# Patient Record
Sex: Male | Born: 1965 | Hispanic: No | Marital: Married | Smoking: Never smoker
Health system: Southern US, Community
[De-identification: ages and names within clinical notes are randomized; demographics above are authoritative.]

## PROBLEM LIST (undated history)

## (undated) DIAGNOSIS — J45909 Unspecified asthma, uncomplicated: Secondary | ICD-10-CM

---

## 2009-06-10 ENCOUNTER — Encounter: Payer: Self-pay | Admitting: Pulmonary Disease

## 2009-06-29 ENCOUNTER — Encounter: Payer: Self-pay | Admitting: Pulmonary Disease

## 2009-07-20 ENCOUNTER — Ambulatory Visit: Payer: Self-pay | Admitting: Pulmonary Disease

## 2009-07-20 DIAGNOSIS — R05 Cough: Secondary | ICD-10-CM

## 2009-08-16 ENCOUNTER — Encounter: Payer: Self-pay | Admitting: Pulmonary Disease

## 2009-08-17 ENCOUNTER — Encounter: Payer: Self-pay | Admitting: Pulmonary Disease

## 2009-08-21 ENCOUNTER — Ambulatory Visit: Payer: Self-pay | Admitting: Pulmonary Disease

## 2009-08-21 ENCOUNTER — Telehealth: Payer: Self-pay | Admitting: Pulmonary Disease

## 2009-08-21 DIAGNOSIS — R599 Enlarged lymph nodes, unspecified: Secondary | ICD-10-CM | POA: Insufficient documentation

## 2009-08-22 ENCOUNTER — Telehealth: Payer: Self-pay | Admitting: Pulmonary Disease

## 2009-08-25 ENCOUNTER — Ambulatory Visit: Payer: Self-pay | Admitting: Pulmonary Disease

## 2009-08-25 ENCOUNTER — Ambulatory Visit: Admission: RE | Admit: 2009-08-25 | Discharge: 2009-08-25 | Payer: Self-pay | Admitting: Pulmonary Disease

## 2009-08-26 DIAGNOSIS — J984 Other disorders of lung: Secondary | ICD-10-CM

## 2009-08-31 ENCOUNTER — Telehealth (INDEPENDENT_AMBULATORY_CARE_PROVIDER_SITE_OTHER): Payer: Self-pay | Admitting: *Deleted

## 2009-09-01 ENCOUNTER — Ambulatory Visit: Payer: Self-pay | Admitting: Pulmonary Disease

## 2009-09-01 LAB — CONVERTED CEMR LAB: Sed Rate: 15 mm/hr (ref 0–22)

## 2009-09-09 ENCOUNTER — Encounter: Payer: Self-pay | Admitting: Pulmonary Disease

## 2009-09-29 ENCOUNTER — Ambulatory Visit: Payer: Self-pay | Admitting: Pulmonary Disease

## 2009-12-22 ENCOUNTER — Ambulatory Visit: Payer: Self-pay | Admitting: Pulmonary Disease

## 2010-01-23 ENCOUNTER — Ambulatory Visit: Payer: Self-pay | Admitting: Radiology

## 2010-01-23 ENCOUNTER — Ambulatory Visit (HOSPITAL_BASED_OUTPATIENT_CLINIC_OR_DEPARTMENT_OTHER): Admission: RE | Admit: 2010-01-23 | Discharge: 2010-01-23 | Payer: Self-pay | Admitting: Pulmonary Disease

## 2010-01-23 ENCOUNTER — Ambulatory Visit: Payer: Self-pay | Admitting: Pulmonary Disease

## 2010-01-23 ENCOUNTER — Telehealth (INDEPENDENT_AMBULATORY_CARE_PROVIDER_SITE_OTHER): Payer: Self-pay | Admitting: *Deleted

## 2010-01-23 DIAGNOSIS — J329 Chronic sinusitis, unspecified: Secondary | ICD-10-CM | POA: Insufficient documentation

## 2010-02-21 ENCOUNTER — Telehealth (INDEPENDENT_AMBULATORY_CARE_PROVIDER_SITE_OTHER): Payer: Self-pay | Admitting: *Deleted

## 2010-03-07 ENCOUNTER — Telehealth: Payer: Self-pay | Admitting: Pulmonary Disease

## 2010-03-13 ENCOUNTER — Ambulatory Visit: Payer: Self-pay | Admitting: Pulmonary Disease

## 2010-04-17 ENCOUNTER — Telehealth: Payer: Self-pay | Admitting: Pulmonary Disease

## 2010-07-24 NOTE — Progress Notes (Signed)
Summary: work note faxed  Phone Note Call from Patient   Caller: Spouse Call For: Hunter Stanley Summary of Call: pt was in today to see dr Vassie Loll  he need a work note faxed to his job at [phone] made to the attn: of [employer]. Initial call taken by: Rickard Patience,  January 23, 2010 1:59 PM  Follow-up for Phone Call        faxed work note to # provided and called and spoke with pt's wife and she is aware of this. nothing further needed.  Aundra Millet Reynolds LPN  January 23, 2010 2:12 PM

## 2010-07-24 NOTE — Letter (Signed)
Summary: [employer]  [address]   [place], [place] [address]   Phone: [phone]  Fax: [phone]    Today's Date: January 23, 2010  Name of Patient: Hunter Stanley  The above named patient had a medical visit today at:  11:00am  Please take this into consideration when reviewing the time away from work/school.    Special Instructions:  [ X ] None  [  ] To be off the remainder of today, returning to the normal work / school schedule tomorrow.  [  ] To be off until the next scheduled appointment on ______________________.  [  ] Other ________________________________________________________________ ________________________________________________________________________   Sincerely yours,   Cyril Mourning, M.D.

## 2010-07-24 NOTE — Assessment & Plan Note (Signed)
Summary: cough/pt ok w/[facility] office/apc   Visit Type:  Acute Copy to:  self Primary Provider/Referring Provider:  n/a  CC:  [name].  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 Cough disapeared after bscopy in april '11. He feels it may have ben related to cold weather. Then relapsed 7/11 - no clear environmental or work related  trigger.  Cough was of insidious onset, no preceding URI,  mostly non productive. He denies allergies, has lived in [place] x 23 yrs. No heartburn, recurrent chest colds or post nasaldrip, or childhood hisotry of asthma or wheezing. No fevers or exposure to TB. CXR 06/29/09 no infx or effusions. [facility] ED visit - reviewed labs WC 11.4, BUn/ cr 10/0.87, AEC 900 CT chest reviewed -3 mm nodule LLL, mild mediastinal lymphadenopathy bihilar, & subcarinal (calcified)   Reviewed bscopy data, afb/ fungal cx neg. Bronchial tissue shows infalmmation - lymphocytes mixed with eosinophils , no granulomas. Absolute eos count 900  -reviewed allergy testing  pos for grass, roach , IgE 196 Spirometry 7/11  (coughing fits) showed moderate restriction, no obstruction  Therapy -Acid suppression x 6 weeks +CPM/ PSE combination did not help Tessalon perles did not help. Advair & avelox have helped.    Tussionex helps x 4h , promet- DM makes him nauseous   January 23, 2010 11:21 AM  Cough improved marginally then returned , he stopped qvar due to thinning of his gums. Has been of work x 4 ds. Saw ENT - put on omeprazole. Cannot sleep. Did not take zyrtec. Reviewed bscopydata & / allergy testing  Preventive Screening-Counseling & Management  Alcohol-Tobacco     Smoking Status: quit     Packs/Day: 0.5     Year Started: 1985     Year Quit: 2005  Current Medications  (verified): 1)  Vicks Nyquil Cough 6.25-15 Mg/60ml Liqd (Doxylamine-Dm) .... At Bedtime 2)  Qvar 80 Mcg/act Aers (Beclomethasone Dipropionate) .... Inhale 2 Puffs Two Times A Day 3)  Omeprazole 40 Mg Cpdr (Omeprazole) .... Take 1 Tablet By Mouth Two Times A Day 4)  Tussionex Pennkinetic Er 8-10 Mg/31ml Lqcr (Chlorpheniramine-Hydrocodone) .... 2.5 - 5ml By Mouth Two Times A Day As Needed  Allergies (verified): No Known Drug Allergies  Past History:  Past Medical History: sinusitis eosinophilic bronchitis  Review of Systems       The patient complains of prolonged cough.  The patient denies anorexia, fever, weight loss, weight gain, vision loss, decreased hearing, hoarseness, chest pain, syncope, dyspnea on exertion, peripheral edema, headaches, hemoptysis, abdominal pain, melena, hematochezia, severe indigestion/heartburn, hematuria, muscle weakness, suspicious skin lesions, difficulty walking, depression, unusual weight change, and abnormal bleeding.    Vital Signs:  Patient profile:   45 year old male Height:      63 inches Weight:      134 pounds BMI:     23.82 O2 Sat:      94 % on Room air Temp:     98.4 degrees F oral Pulse rate:   82 / minute BP sitting:   108 / 74  (left arm) Cuff size:   regular  Vitals Entered By: Zackery Barefoot CMA (January 23, 2010 11:13 AM)  O2 Flow:  Room air CC: Pt c/o increased non-productive coughing  spells, wheezing worse at night and early morning. Pt states it is not as bad in the day time. Pt c/o "teeth thinning" Comments Medications reviewed with patient Verified contact number and pharmacy with patient Zackery Barefoot CMA  January 23, 2010 11:13 AM    Physical Exam  Additional Exam:  Gen. Pleasant, well-nourished, in no distress, normal affect ENT - no lesions, no post nasal drip Neck: No JVD, no thyromegaly, no carotid bruits Lungs: no use of accessory muscles, no dullness to percussion, clear without rales or rhonchi    Cardiovascular: Rhythm regular, heart sounds  normal, no murmurs or gallops, no peripheral edema Musculoskeletal: No deformities, no cyanosis or clubbing      Imaging Exam  Procedure date:  01/23/2010  Findings:      CT sinuses  Findings: .  Diffuse circumferential mucosal thickening is present in the maxillary sinuses bilaterally.  There is scattered opacification throughout the ethmoid air cells.  The frontal sinuses are completely occluded bilaterally.  Circumferential mucosal thickening is noted in the sphenoid sinuses bilaterally. No definite mass lesion is seen.  The nasal septum is midline.   IMPRESSION:   1.  Extensive sinus disease as described. 2.  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  Impression & Recommendations:  Problem # 1:  SINUSITIS, CHRONIC (ICD-473.9) Given CT results, augmentin x 14-28 days ENT referral ? drainage  Problem # 2:  COUGH (ICD-786.2) Prednisone course  Stay on qvar two times a day  Cough syrup upto three times a day  - not to operate heavy machinery or driving after taking this Stay on reflux pill & zyrtec -D once daily  Orders: Radiology Referral (Radiology) Misc. Referral (Misc. Ref) Est. Patient Level IV ([address]) Prescription Created Electronically ([id number])  Medications Added to Medication List This Visit: 1)  Qvar 80 Mcg/act Aers (Beclomethasone dipropionate) .... Inhale 2 puffs two times a day 2)  Omeprazole 40 Mg Cpdr (Omeprazole) .... Take 1 tablet by mouth two times a day 3)  Tussionex Pennkinetic Er 8-10 Mg/57ml Lqcr (Chlorpheniramine-hydrocodone) .... 2.5 - 5ml by mouth two times a day as needed 4)  Tussionex Pennkinetic Er 8-10 Mg/66ml Lqcr (Chlorpheniramine-hydrocodone) .... 2.5 - 5ml by mouth  three times a day 5)  Prednisone 10 Mg Tabs (Prednisone) .... Take 4 tabs  daily with food x 4 days, then 3 tabs daily x 4 days, then 2 tabs daily x 7 days, then 1 tab daily x47days then stop. #50  Patient  Instructions: 1)  Prednisone course  2)  Stay on qvar two times a day  3)  Cough syrup upto three times a day  - not to operate heavy machinery or driving after taking this 4)  Stay on reflux pill & zyrtec -D once daily  5)  A Limited Sinus CT has been recommended.  Your imaging study may require preauthorization.  6)  We will get you second opinion Prescriptions: TUSSIONEX PENNKINETIC ER 8-10 MG/5ML LQCR (CHLORPHENIRAMINE-HYDROCODONE) 2.5 - 5ml by mouth  three times a day  #200 x 0   Entered and Authorized by:   Comer Locket. Vassie Loll MD   Signed by:   Comer Locket Vassie Loll MD on 01/23/2010   Method used:   Print then Give to Patient   RxID:   [id number] PREDNISONE 10 MG TABS (PREDNISONE) Take 4 tabs  daily with food x 4 days, then 3 tabs daily x 4 days, then 2 tabs daily x 7 days, then 1 tab daily x47days then  stop. #50  #50 x 0   Entered and Authorized by:   Comer Locket Vassie Loll MD   Signed by:   Comer Locket Vassie Loll MD on 01/23/2010   Method used:   Electronically to        [facility]* (retail)       [address]       [place], [place]  [address]       Ph: [phone]       Fax: [phone]   RxID:   [id number]

## 2010-07-24 NOTE — Progress Notes (Signed)
Summary: dry cough returned x2days - needs to f/u with ENT  Phone Note Call from Patient Call back at [phone]   Caller: Spouse Call For: alva Summary of Call: need to talk to dr Vassie Loll about husband's condition Initial call taken by: Rickard Patience,  February 21, 2010 5:00 PM  Follow-up for Phone Call        called spoke with patient's wife who states patient dry cough has returned x2 days with some increased SOB.  denies wheezing, mucus production.  wife states that the pred taper patient was given at last ov helped cough a great deal and wonders if patient may have a refill.  would also like to know if patient may have a work note excusing him from work aug 14-18.  wife is aware that RA is out of the office that they may not receive a call back until tomorrow.  she is okay with this.  will forward to doc of the day for today.  she is also aware that RA will have to be the one to approve/deny the work note. Boone Master CNA/MA  February 21, 2010 5:17 PM   Additional Follow-up for Phone Call Additional follow up Details #1::        the pt had terrible sinusitis on his ct scan...was referred to ENT...did he go?Marland KitchenMarland KitchenMarland KitchenHis cough may not resolve until his sinus issue resolves.  Is he scheduled for f/u with ENT? Additional Follow-up by: Barbaraann Share MD,  February 21, 2010 5:31 PM    Additional Follow-up for Phone Call Additional follow up Details #2::    ATC thehome #, no answer, no voicemail. Also called other number given and [email or web address].Carron Curie CMA  February 22, 2010 10:01 AM  [facility] on the "other number" Vernie Murders  February 23, 2010 11:45 AM  pt wife returned call.  she states that they cancelled the first ENT appt scheduled for 8.12.11 and has not rescheduled this.  i advised pt's wife that cough may not resolve until the sinus issues resolve and that they need to go ahead and rsc the ENT appt.  will forward to doc of the day for further recs.  Dr. Delford Field, is there anything i need to  add?  wife states may leave answer on her VM. Boone Master CNA/MA  February 23, 2010 12:05 PM     Additional Follow-up for Phone Call Additional follow up Details #3:: Details for Additional Follow-up Action Taken: I have nothing else to add to dr clance recommendations  Additional Follow-up by: Storm Frisk MD,  February 23, 2010 12:16 PM    called LM on wife's VM voicing that they need to reschedule the ENT appt as stated by both [facility] and [facility] above.  encuoraged her to call with any other questions/concerns.  will sign off on message and forward to Dr. Vassie Loll to make him aware. Boone Master CNA/MA  February 23, 2010 12:20 PM   Appended Document: dry cough returned x2days - needs to f/u with ENT can we FU to see if they rescheduled ent appt  Appended Document: dry cough returned x2days - needs to f/u with ENT LMOMTCB x 1 on wife's vmail. jwr  Appended Document: dry cough returned x2days - needs to f/u with ENT Pt was seen by Dr. Kathi Der PA on Friday, pt seen today, CT Sinus faxed to Dr. Kathi Der office and pt's spouse aware will need to pick up film from [place] to take to ENT  follow up appointment. jwr

## 2010-07-24 NOTE — Progress Notes (Signed)
Summary: ? ENT followup   Phone Note Call from Patient Call back at [phone]   Caller: Spouse-oun Call For: clance Reason for Call: Talk to Nurse Summary of Call: pt returned call to [facility].  She said when you call back leave it on cell phone.   Initial call taken by: Eugene Gavia,  March 07, 2010 1:56 PM  Follow-up for Phone Call        [facility] on pt's spouse Uon's cell phone advising that we need to know if pt kept appt with ENT.  Will await a call back Vernie Murders  March 07, 2010 2:35 PM  Spoke with pt's spouse, Uon.  Per Uon, they never made appt for ENT because pt "didn't want to go."  States he is still coughing "bad."  States this clears up when he is on meds but when he finishes it cough returns.  Advised cough might not resolve until sinus issues are resolved and that's why RA wanted him to see ENT.  She verbalized understanding and states they might call to schedule appt with ENT.  Will forward to RA so he is aware and in case he has any further recs.  **Spouse requesting to leave message on her VM if we call back.   Follow-up by: Gweneth Dimitri RN,  March 08, 2010 11:33 AM  Additional Follow-up for Phone Call Additional follow up Details #1::        please emphasize it is absolutely important for him to see ENT  Additional Follow-up by: Comer Locket. Vassie Loll MD,  March 08, 2010 11:43 AM    Additional Follow-up for Phone Call Additional follow up Details #2::    Never received return call. Pt seen in office today. Zackery Barefoot CMA  March 13, 2010 9:18 AM

## 2010-07-24 NOTE — Miscellaneous (Signed)
Summary: Allergy Panel/[facility]  Allergy Panel/[facility]   Imported By: Sherian Rein 01/03/2010 08:54:01  _____________________________________________________________________  External Attachment:    Type:   Image     Comment:   External Document

## 2010-07-24 NOTE — Assessment & Plan Note (Signed)
Summary: cough/in [facility] office/apc   Copy to:  self Primary Provider/Referring Provider:  n/a  CC:  Pt here for follow up. Pt states was seen by ENT on Friday and was given Nasonex w/o relief. Pt states cough returned x 2 weeks ago after running out of meds Dr. Vassie Loll prescribed.  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 Cough disapeared after bscopy in april '11. He feels it may have ben related to cold weather. Then relapsed 7/11 - no clear environmental or work related  trigger.  Cough was of insidious onset, no preceding URI,  mostly non productive. He denies allergies, has lived in [place] x 23 yrs. No heartburn, recurrent chest colds or post nasaldrip, or childhood hisotry of asthma or wheezing. No fevers or exposure to TB. CXR 06/29/09 no infx or effusions. [facility] ED visit - reviewed labs WC 11.4, BUn/ cr 10/0.87, AEC 900 CT chest reviewed -3 mm nodule LLL, mild mediastinal lymphadenopathy bihilar, & subcarinal (calcified)   Reviewed bscopy data, afb/ fungal cx neg. Bronchial tissue shows infalmmation - lymphocytes mixed with eosinophils , no granulomas. Absolute eos count 900  -reviewed allergy testing  pos for grass, roach , IgE 196 Spirometry 7/11  (coughing fits) showed moderate restriction, no obstruction  Therapy -Acid suppression x 6 weeks +CPM/ PSE combination did not help Tessalon perles did not help. Advair & avelox have helped.    Tussionex helps x 4h , promet- DM makes him nauseous   January 23, 2010 11:21 AM  Cough improved marginally then returned , he stopped qvar due to thinning of his gums. Has been of work x 4 ds. Saw ENT - put on omeprazole. Cannot sleep. Did not take zyrtec. Reviewed bscopydata & / allergy testing  March 13, 2010 9:38 AM  Saw Dr Jearld Fenton on 9/16 --nasonex, awaiting CT review. Cough has returned, felt much improved after Abx course, no heartburn  Preventive Screening-Counseling &  Management  Alcohol-Tobacco     Smoking Status: quit     Packs/Day: 0.5     Year Started: 1985     Year Quit: 2005  Current Medications (verified): 1)  Vicks Nyquil Cough 6.25-15 Mg/19ml Liqd (Doxylamine-Dm) .... At Bedtime 2)  Qvar 80 Mcg/act Aers (Beclomethasone Dipropionate) .... Inhale 2 Puffs Two Times A Day As Needed 3)  Zyrtec Allergy 10 Mg Tabs (Cetirizine Hcl) .... Take 1 Tablet By Mouth Two Times A Day 4)  Nasonex 50 Mcg/act Susp (Mometasone Furoate) .... 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   Review of Systems       The patient complains of prolonged cough.  The patient denies anorexia, fever, weight loss, weight gain, vision loss, decreased hearing, hoarseness, chest pain, syncope, dyspnea on exertion, peripheral edema, headaches, hemoptysis, abdominal pain, melena, hematochezia, severe indigestion/heartburn, hematuria, muscle weakness, suspicious skin lesions, difficulty walking, depression, unusual weight change, abnormal bleeding, enlarged lymph nodes, and angioedema.    Vital Signs:  Patient profile:   45 year old male Height:      63 inches Weight:      143 pounds BMI:     25.42 O2 Sat:      98 % on  Room air Temp:     98.4 degrees F oral Pulse rate:   80 / minute BP sitting:   114 / 84  (left arm) Cuff size:   regular  Vitals Entered By: Zackery Barefoot CMA (March 13, 2010 9:24 AM)  O2 Flow:  Room air CC: Pt here for follow up. Pt states was seen by ENT on Friday and was given Nasonex w/o relief. Pt states cough returned x 2 weeks ago after running out of meds Dr. Vassie Loll prescribed Comments Medications reviewed with patient Verified contact  number and pharmacy with patient Zackery Barefoot CMA  March 13, 2010 9:24 AM    Physical Exam  Additional Exam:  Gen. Pleasant, well-nourished, in no distress, normal affect ENT - no lesions, no post nasal drip Neck: No JVD, no thyromegaly, no carotid bruits Lungs: no use of accessory muscles, no dullness to percussion, clear without rales or rhonchi  Cardiovascular: Rhythm regular, heart sounds  normal, no murmurs or gallops, no peripheral edema Musculoskeletal: No deformities, no cyanosis or clubbing      CT of Sinus  Procedure date:  01/23/2010  Findings:      CT PARANASAL SINUS LIMITED WITHOUT CONTRAST   Technique:  Multidetector CT images of the paranasal sinuses were obtained in a single plane without contrast.   Comparison: None available   Findings: .  Diffuse circumferential mucosal thickening is present in the maxillary sinuses bilaterally.  There is scattered opacification throughout the ethmoid air cells.  The frontal sinuses are completely occluded bilaterally.  Circumferential mucosal thickening is noted in the sphenoid sinuses bilaterally. No definite mass lesion is seen.  The nasal septum is midline.   IMPRESSION:   1.  Extensive sinus disease as described. 2.  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    Impression & Recommendations:  Problem # 1:  SINUSITIS, CHRONIC (ICD-473.9) Needs drainage procedure likley augmentin x 14 ds again d/w Dr Jearld Fenton  Problem # 2:  COUGH (ICD-786.2)  Combination of sinusitis & reflux. ct omeprazole x 3 mnths for caid suppression cough suppressant for symptomatic relief.  Orders: Est. Patient Level III ([address]) Prescription Created Electronically [id number])  Medications Added to Medication List This Visit: 1)  Qvar 80 Mcg/act Aers (Beclomethasone dipropionate) .... Inhale 2 puffs two times a day as needed 2)  Zyrtec Allergy 10 Mg Tabs (Cetirizine hcl) .... Take 1 tablet by mouth two times a  day 3)  Nasonex 50 Mcg/act Susp (Mometasone furoate) .... 2 sprays each nostril two times a day 4)  Amoxicillin-pot Clavulanate 875-125 Mg Tabs (Amoxicillin-pot clavulanate) .... Two times a day 5)  Omeprazole 20 Mg Cpdr (Omeprazole) .... Once daily  Patient Instructions: 1)  Copy sent to: Dr Jearld Fenton 2)  Please schedule a follow-up appointment in 1 month. 3)  Augmentin x 14 days 4)  Reflux pill x 3 months 5)  I will speak to ENT MD about sinus drainage  Prescriptions: OMEPRAZOLE 20 MG CPDR (OMEPRAZOLE) once daily  #30 x 2   Entered and Authorized by:   Comer Locket Vassie Loll MD   Signed by:   Comer Locket Vassie Loll MD on 03/13/2010   Method used:   Electronically to        [facility] (retail)       [address]       [place], [place]  [address]       Ph: [phone]  Fax: [phone]   RxID:   [id number] AMOXICILLIN-POT CLAVULANATE 875-125 MG TABS (AMOXICILLIN-POT CLAVULANATE) two times a day  #28 x 0   Entered and Authorized by:   Comer Locket. Vassie Loll MD   Signed by:   Comer Locket Vassie Loll MD on 03/13/2010   Method used:   Electronically to        [facility] (retail)       [address]       [place], [place]  [address]       Ph: [phone]       Fax: [phone]   RxID:   [id number]

## 2010-07-24 NOTE — Progress Notes (Signed)
Summary: Sinus surgery 04/16/10  Phone Note Outgoing Call Call back at [phone]   Summary of Call: Called to see if pt was going to make it to today's appointment. Wife advised pt had sinus surgery yesterday and will not make the appointment today and is planned to be out of work x  week and will follow up with Dr. Christell Constant in 2 weeks. Pt will call back to reschedule follow up. Zackery Barefoot CMA  April 17, 2010 11:05 AM  Initial call taken by: Zackery Barefoot CMA,  April 17, 2010 11:04 AM  Follow-up for Phone Call        ok to reschedule in 1 month Follow-up by: Comer Locket. Vassie Loll MD,  April 17, 2010 11:19 AM

## 2010-07-24 NOTE — Progress Notes (Signed)
Summary: results  Phone Note Call from Patient Call back at [phone]   Caller: Spouse Call For: Hunter Stanley Summary of Call: callling to get results of bronch Initial call taken by: Rickard Patience,  August 31, 2009 4:54 PM  Follow-up for Phone Call        Please advise results, thanks Vernie Murders  August 31, 2009 4:57 PM  -No TB or other infection -showed inflammation in the lung -would like him to take by mouth steroids. He needs to show for his appt to discuss further. OK to add on tomorrow  Follow-up by: Comer Locket. Vassie Loll MD,  August 31, 2009 5:03 PM  Additional Follow-up for Phone Call Additional follow up Details #1::        Spoke with pt's spouse and advised of the above recs per RA.  Aware that rx has been sent.  Appt sched with RA for tommorrow at 3:45 pm. Additional Follow-up by: Vernie Murders,  August 31, 2009 5:27 PM    New/Updated Medications: PREDNISONE 10 MG TABS (PREDNISONE) 2 tabs once daily with food Prescriptions: PREDNISONE 10 MG TABS (PREDNISONE) 2 tabs once daily with food  #60 x 1   Entered and Authorized by:   Comer Locket Vassie Loll MD   Signed by:   Comer Locket Vassie Loll MD on 08/31/2009   Method used:   Electronically to        [facility] (retail)       [address]       [place], [place]  [address]       Ph: [phone]       Fax: [phone]   RxID:   [id number]

## 2010-07-24 NOTE — Progress Notes (Signed)
Summary: Appt --NA @ home #/wk # disconnected--will keep trying home #  Phone Note Call from Patient   Caller: Spouse Reason for Call: Acute Illness Summary of Call: Patient's wife Oun wants patient to be seen today.  Since last visit, patient has been to [facility] where he was informed that he has mass in lung.  Symptoms include bad cough & congestion.  Please advise. Initial call taken by: Leonette Monarch,  August 21, 2009 9:41 AM  Follow-up for Phone Call        you can work him in [facility] d[facility] at 1:30 Follow-up by: Comer Locket. Vassie Loll MD,  August 21, 2009 2:04 PM  Additional Follow-up for Phone Call Additional follow up Details #1::        NA at home # and the work # is disconnected will try back later Philipp Deputy [facility]  August 21, 2009 2:56 PM     Additional Follow-up for Phone Call Additional follow up Details #2::    spoke with wife and per dr Vassie Loll have the pt come now for appt.--wife aware and will have husband pick her up then get x-ray and come here   Philipp Deputy [facility]  August 21, 2009 3:16 PM

## 2010-07-24 NOTE — Assessment & Plan Note (Signed)
Summary: ACUTE//TD   Visit Type:  Sick visit Copy to:  self Primary Provider/Referring Provider:  n/a  CC:  Pt c/o intermittent productive cough with grey mucus, chest congestion, wheezing, and chest pain. Pt states breathing is not better since last OV with RA.  History of Present Illness: [name], non smoker for evaluation of chronic cough since 9/10. He works as an Systems developer  at [employer]. Cough was of insidious onset, no preceding URI, he 'never gets sick',  now mostly non productive, no clear triggers, no diminishing factors. He denies allergies, has lived in [place] x 23 yrs. No heartburn, recurrent chest colds or post nasaldrip, or childhood hisotry of asthma or wheezing. No fevers or exposure to TB. He has had urgent care visits x 3 & ER visit to [facility] x 1. Given antibiotic. Tussionex helps him sleep at night, albuterol only helps after a bout of coughing. CXR 06/29/09 no infx or effusions.  DD for cough of this duration with nml CXR includes upper airway cough or GERD. Unlikely asthma  August 21, 2009 5:01 PM  c/o intermittent productive cough with grey mucus, chest congestion, wheezing, and chest pain. Pt states breathing is not better since last OV. Acid suppression x 6 weeks +CPM/ PSE combination did not help Tussionex 2.5- 5ml by mouth at bedtime helps for a few hours Tessalon perles did not help. coughing speels in the office with minimal green phlegm. [facility] ED visit - reviewed labs WC 11.4, BUn/ cr 10/0.87, AEC 900 CT chest reviewed -3 mm nodule LLL, mild mediastinal lymphadenopathy bihilar, & subcarinal (calcified)             Current Medications (verified): 1)  Proair Hfa 108 (90 Base) Mcg/act Aers (Albuterol Sulfate) .... As Needed 2)  Advair Diskus 100-50 Mcg/dose Aepb (Fluticasone-Salmeterol) .... 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   Risk Factors: Smoking Status: quit (07/20/2009) Packs/Day: 0.5 (07/20/2009)  Review of Systems       The patient complains of prolonged cough.  The patient denies anorexia, fever, weight loss, weight gain, vision loss, decreased hearing, hoarseness, chest pain, syncope, dyspnea on exertion, peripheral edema, headaches, hemoptysis, abdominal pain, melena, hematochezia, severe indigestion/heartburn, hematuria, muscle weakness, suspicious skin lesions, transient blindness, difficulty walking, depression, unusual weight change, and abnormal bleeding.    Vital Signs:  Patient profile:   45 year old male Height:      63 inches Weight:      136.13 pounds O2 Sat:      97 % on Room air Temp:     98.1 degrees F oral Pulse rate:   82 / minute BP sitting:   98 / 62  (left arm) Cuff size:   regular  Vitals Entered By: Zackery Barefoot CMA (August 21, 2009 4:49 PM)  O2 Flow:  Room air CC: Pt c/o intermittent productive cough with grey mucus, chest congestion, wheezing, chest pain. Pt states breathing is not better since last OV with RA Comments Medications reviewed with patient Verified contact number and pharmacy with patient Zackery Barefoot CMA  August 21, 2009 4:51 PM    Physical Exam  Additional Exam:  Gen. Pleasant, well-nourished, in no distress, normal affect ENT - no lesions, no post nasal drip Neck: No JVD, no thyromegaly, no carotid bruits Lungs: no use of  accessory muscles, no dullness to percussion, clear without rales or rhonchi  Cardiovascular: Rhythm regular, heart sounds  normal, no murmurs or gallops, no peripheral edema Abdomen: soft and non-tender, no hepatosplenomegaly, BS normal. Musculoskeletal: No deformities, no cyanosis or clubbing Neuro:  alert, non focal     Impression & Recommendations:  Problem # 1:  COUGH (ICD-786.2) Treat for bronchitis with avelox x 10 ds - green  phlegm Obtain quantiferon gold assay Chk LDH, ANA, urine histo Ag, ESR, ACE level Proceed with bronchoscopy & biopsies for eosinophilic bronchitis. Meanwhile Dm cough syrup & advair 250/50 two times a day  Orders: Est. Patient Level V (11914) Prescription Created Electronically [phone]) Misc. Referral (Misc. 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 Obviously try to avoid surgical approach here. The calcification in lymph nodes denotes chronicity. Cannot r/o sarcoid. His updated medication list for this problem includes:    Avelox 400 Mg Tabs (Moxifloxacin hcl) ..... Once daily  Medications Added to Medication List This Visit: 1)  Advair Diskus 100-50 Mcg/dose Aepb (Fluticasone-salmeterol) .... Inhale 1 puff two times a day 2)  Avelox 400 Mg Tabs (Moxifloxacin hcl) .... Once daily 3)  Promethazine-dm 6.25-15 Mg/50ml Syrp (Promethazine-dm) .... 5 ml by mouth three times a day as needed 4)  Advair Diskus 250-50 Mcg/dose Aepb (Fluticasone-salmeterol) .... Two times a day  Patient Instructions: 1)  Copy sent to: 2)  Advair 250/50 two times a day sample 3)  Cough syrup 4)  Bronchoscopy for Friday 12 noon - at [facility]  5)   - Liquid breakfast Prescriptions: ADVAIR DISKUS 250-50 MCG/DOSE AEPB (FLUTICASONE-SALMETEROL) two times a day  #1 x 2   Entered and Authorized by:   Comer Locket Vassie Loll MD   Signed by:   Comer Locket Vassie Loll MD on 08/21/2009   Method used:   Electronically to        [facility]* (retail)       [address]       [place], [place]  [address]       Ph: [phone]       Fax: [phone]   RxID:   [id number] PROMETHAZINE-DM 6.25-15 MG/5ML SYRP (PROMETHAZINE-DM) 5 ml by mouth three times a day as needed  #150 x 1   Entered and Authorized by:   Comer Locket.  Vassie Loll MD   Signed by:   Comer Locket Vassie Loll MD on 08/21/2009   Method used:   Electronically to        [facility]* (retail)       [address]       [place], [place]  [address]       Ph: [phone]       Fax: [phone]   RxID:   [id number] AVELOX 400 MG TABS (MOXIFLOXACIN HCL) once daily  #10 x 0   Entered and Authorized by:   Comer Locket. Vassie Loll MD   Signed by:   Comer Locket Vassie Loll MD on 08/21/2009   Method used:   Electronically to        [facility] (retail)       [address]       [facility]  [place], [place]  [address]       Ph: [phone]       Fax: [phone]   RxID:   [id number]

## 2010-07-24 NOTE — Letter (Signed)
Summary: Work Consulting civil engineer  [address]   [place], [place] [address]   Phone: [phone]  Fax: [phone]    Today's Date: July 20, 2009  Name of Patient: Hunter Stanley  The above named patient had a medical visit today at:  3:30 pm.  Please take this into consideration when reviewing the time away from work.    Special Instructions:  [ x ] None  [  ] To be off the remainder of today, returning to the normal work / school schedule tomorrow.  [  ] To be off until the next scheduled appointment on ______________________.  [  ] Other ________________________________________________________________ ________________________________________________________________________   Sincerely yours,    Dr. Cyril Mourning

## 2010-07-24 NOTE — Assessment & Plan Note (Signed)
Summary: rov/discuss results/lmr   Visit Type:  Follow-up Copy to:  self Primary Provider/Referring Provider:  n/a  CC:  Pt here for follow up with results. Pt states coughing less.  History of Present Illness: [name], non smoker for evaluation of chronic cough since 9/10. He works as an Systems developer  at [employer]. Cough was of insidious onset, no preceding URI, he 'never gets sick',  now mostly non productive, no clear triggers, no diminishing factors. He denies allergies, has lived in [place] x 23 yrs. No heartburn, recurrent chest colds or post nasaldrip, or childhood hisotry of asthma or wheezing. No fevers or exposure to TB. He has had urgent care visits x 3 & ER visit to [facility] x [facility]. Given antibiotic. Tussionex helps him sleep at night, albuterol only helps after a bout of coughing. CXR 06/29/09 no infx or effusions.  August 21, 2009 5:01 PM  c/o intermittent productive cough with grey mucus, chest congestion, wheezing, and chest pain. Pt states cough/breathing is not better since last OV. Acid suppression x 6 weeks +CPM/ PSE combination did not help Tussionex 2.5- 5ml by mouth at bedtime helps for a few hours Tessalon perles did not help. coughing speels in the office with minimal green phlegm. [facility] ED visit - reviewed labs WC 11.4, BUn/ cr 10/0.87, AEC 900 CT chest reviewed -3 mm nodule LLL, mild mediastinal lymphadenopathy bihilar, & subcarinal (calcified)   September 01, 2009 9:53 AM    Reviewed bscopy data, afb/ fungal cx neg. Bronchial tissue shows infalmmation - lymphocytes mixed with eosinophils , no granulomas. Absolute eos count 900 Advair & avelox have helped.    prescribed tussionex x 150 ml, promet- DM makes him nauseous - but has not needed this last 3 ds      Current Medications (verified): 1)  Proair Hfa 108 (90 Base) Mcg/act Aers (Albuterol Sulfate) .... As Needed (Rare) 2)  Advair Diskus 100-50 Mcg/dose Aepb (Fluticasone-Salmeterol) .... 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   Review of Systems  The patient denies anorexia, fever, weight loss, weight gain, vision loss, decreased hearing, hoarseness, chest pain, syncope, dyspnea on exertion, peripheral edema, prolonged cough, headaches, hemoptysis, abdominal pain, melena, hematochezia, severe indigestion/heartburn, hematuria, muscle weakness, difficulty walking, depression, unusual weight change, and abnormal bleeding.    Vital Signs:  Patient profile:   45 year old male Height:      63 inches Weight:      138 pounds O2 Sat:      97 % on Room air Temp:     98.1 degrees F oral Pulse rate:   92 / minute BP sitting:   120 / 72  (right arm) Cuff size:   regular  Vitals Entered By: Zackery Barefoot CMA (September 01, 2009 3:56 PM)  O2 Flow:  Room air CC: Pt here for follow up with results. Pt states coughing less Comments Medications reviewed with patient Verified contact number and pharmacy with patient Zackery Barefoot CMA  September 01, 2009 3:57 PM    Physical Exam  Additional Exam:  Gen. 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 As needed (rare) 2)  Advair Diskus 100-50 Mcg/dose Aepb (Fluticasone-salmeterol) .... Inhale 2 puffs two times a day  Other Orders: TLB-Sedimentation Rate (ESR) (85652-ESR) T-Angiotensin i-Converting Enzyme ([id number]) T-Antinuclear Antib (ANA) [phone]) T- * Misc. Laboratory test [phone]) T-Lactate Dehydrogenase (LDH) [phone]) Est. Patient Level IV ([address]295) Prescription Created Electronically [phone])  Patient Instructions: 1)  Copy sent to: 2)  Please schedule a follow-up appointment in 4 weeks. 3)  Blood work today 4)  Take advair 1 puff two times a day  5)  Prednisone 10 mg - 2 tabs once daily x 10 days then 1 tab once daily  6)  we discussed adverse efects of steroids

## 2010-07-24 NOTE — Progress Notes (Signed)
Summary: Labs before bronch  ---- Converted from flag ---- ---- 08/22/2009 5:11 PM, Comer Locket. Vassie Loll MD wrote: Dorothea Glassman scheduled for friday afternoon. Can u have him come in before for blood work incliuding  -LDH, ANA, ESR, ACE level, quantiferon assay & urine histo ------------------------------  Phone Note Outgoing Call   Summary of Call: pt informed.   Follow-up for Phone Call        LABS ENTERED IN EMR AND IDX, PT TO GO TO OUR LAB BEFORE [facility] PER RA. Zackery Barefoot CMA  August 24, 2009 5:28 PM

## 2010-07-24 NOTE — Assessment & Plan Note (Signed)
Summary: cough/  okay with h/p appt [facility]   Visit Type:  Initial Consult Copy to:  self Primary Provider/Referring Provider:  n/a  CC:  Pt here for pulmonary consult. Pt c/o dry cough since Sept 2010. Pt treated with zpack 06-29-2009.  History of Present Illness: 45/M, non smoker for evaluation of chronic cough since 9/10. He works as an Systems developer  at [employer]. Cough was of insidious onset, no preceding URI, he 'never gets sick',  now mostly non productive, no clear triggers, no diminishing factors. He denies allergies, has lived in [place] x 23 yrs. No heartburn, recurrent chest colds or post nasaldrip, or childhood hisotry of asthma or wheezing. No fevers or exposure to TB. He has had urgent care visits x 3 & ER visit to [facility] x [facility]. Given antibiotic. Tussionex helps him sleep at night, albuterol only helps after a bout of coughing. CXR 06/29/09 no infx or effusions.  Preventive Screening-Counseling & Management  Alcohol-Tobacco     Smoking Status: quit     Packs/Day: 0.5     Year Started: 1985     Year Quit: 2005  Current Medications (verified): 1)  Proair Hfa 108 (90 Base) Mcg/act Aers (Albuterol Sulfate) .... As Needed 2)  Tussionex Pennkinetic Er 8-10 Mg/54ml Lqcr (Chlorpheniramine-Hydrocodone) .... Take 1 Teaspoonful Every 12 Hours As Needed 3)  Loratadine 10 Mg Tabs (Loratadine) .... 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  Smoking Status:  quit Packs/Day:  0.5  Review of Systems       The patient complains of productive cough, non-productive cough, chest pain, sore throat, nasal congestion/difficulty breathing through nose, and change in color of mucus.  The patient denies shortness of  breath with activity, shortness of breath at rest, coughing up blood, irregular heartbeats, acid heartburn, indigestion, loss of appetite, weight change, abdominal pain, difficulty swallowing, tooth/dental problems, headaches, sneezing, itching, ear ache, anxiety, depression, hand/feet swelling, joint stiffness or pain, rash, and fever.    Vital Signs:  Patient profile:   45 year old male Height:      63 inches Weight:      142 pounds BMI:     25.25 O2 Sat:      95 % on Room air Temp:     98.0 degrees F oral Pulse rate:   85 / minute BP sitting:   118 / 84  (left arm) Cuff size:   regular  Vitals Entered By: Zackery Barefoot CMA (July 20, 2009 3:36 PM)  O2 Flow:  Room air CC: Pt here for pulmonary consult. Pt c/o dry cough since Sept 2010. Pt treated with zpack 06-29-2009 Comments Medications reviewed with patient Verified pt's contact number Zackery Barefoot CMA  July 20, 2009 3:51 PM    Physical Exam  Additional Exam:  Gen. Pleasant, well-nourished, in no distress, normal affect ENT - no lesions, no post nasal drip Neck: No JVD, no thyromegaly, no carotid bruits Lungs: no use of accessory muscles, no dullness to percussion, clear without rales or rhonchi  Cardiovascular: Rhythm regular, heart sounds  normal, no murmurs or gallops, no peripheral edema Abdomen: soft and non-tender, no hepatosplenomegaly, BS normal. Musculoskeletal: No deformities, no cyanosis or clubbing Neuro:  alert, non focal  Impression & Recommendations:  Problem # 1:  COUGH (ICD-786.2)  DD for cough of this duration with nml CXR includes upper airway cough or GERD. Unlikely asthma Will  proceed Acid suppression x 6 weeks CPM/ PSE combination - OK for pharmacy to substitute geric equivalent Tussionex 2.5- 5ml by mouth at bedtime  Tessalon perles three times a day as needed  Discussed efects on driving. If no benefit in 1 month, will intensify wu.  Orders: Consultation Level III  (13086)  Medications Added to Medication List This Visit: 1)  Proair Hfa 108 (90 Base) Mcg/act Aers (Albuterol sulfate) .... As needed 2)  Tussionex Pennkinetic Er 8-10 Mg/60ml Lqcr (Chlorpheniramine-hydrocodone) .... Take 1 teaspoonful every 12 hours as needed 3)  Loratadine 10 Mg Tabs (Loratadine) .... Take 1 tablet by mouth once a day 4)  Tessalon 200 Mg Caps (Benzonatate) .... Three times a day as needed for cough 5)  Protonix 40 Mg Tbec (Pantoprazole sodium) .... Once daily 6)  Chlorpheniramine-pseudoeph 8-120 Mg Cr-caps (Chlorpheniramine-pseudoeph) .Marland Kitchen.. 1 tab by mouth two times a day 7)  Tussionex Pennkinetic Er 8-10 Mg/65ml Lqcr (Chlorpheniramine-hydrocodone) .... 2.5- 5ml by mouth at bedtime as needed for cough  Patient Instructions: 1)  Please schedule a follow-up appointment in 1 month. 2)  Acid suppression x 6 weeks 3)  medcine to suppress sinuses - OK for pharmacy to substitute geric equivalent 4)  Cough syrup 2.5- 5ml by mouth at bedtime  5)  Tessalon perles three times a day as needed  Prescriptions: TUSSIONEX PENNKINETIC ER 8-10 MG/5ML LQCR (CHLORPHENIRAMINE-HYDROCODONE) 2.5- 5ml by mouth at bedtime as needed for cough  #120 x 0   Entered and Authorized by:   Comer Locket Vassie Loll MD   Signed by:   Comer Locket Vassie Loll MD on 07/20/2009   Method used:   Print then Give to Patient   RxID:   [id number] CHLORPHENIRAMINE-PSEUDOEPH 8-120 MG CR-CAPS (CHLORPHENIRAMINE-PSEUDOEPH) 1 tab by mouth two times a day  #56 x 0   Entered and Authorized by:   Comer Locket. Vassie Loll MD   Signed by:   Comer Locket Vassie Loll MD on 07/20/2009   Method used:   Electronically to        [facility]* (retail)       [address]       [place], [place]  [address]       Ph: [phone]       Fax: [phone]   RxID:   [id number] PROTONIX 40 MG TBEC (PANTOPRAZOLE SODIUM) once daily  #42 x 1   Entered and Authorized by:   Comer Locket. Vassie Loll MD   Signed by:   Comer Locket Vassie Loll MD on  07/20/2009   Method used:   Electronically to        [facility] (retail)       [address]       [place], [place]  [address]       Ph: [phone]       Fax: [phone]   RxID:   [id number] TESSALON 200 MG CAPS (BENZONATATE) three times a day as needed for cough  #60 x 1   Entered and Authorized by:   Comer Locket. Vassie Loll MD   Signed by:   Comer Locket Vassie Loll MD on 07/20/2009   Method used:   Electronically to        [facility] (retail)       [address]       [place], [place]  [address]       Ph: [phone]       Fax: [phone]   RxID:   [id number]    Immunization History:  Influenza Immunization History:    Influenza:  historical (03/27/2009)

## 2010-07-24 NOTE — Assessment & Plan Note (Signed)
Summary: cough///kp   Visit Type:  Acute visit Copy to:  self Primary Provider/Referring Provider:  n/a  CC:  Pt c/o intermittent productive cough clear to green mucus worse at night and when laying down .  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  Cough was of insidious onset, no preceding URI,  mostly non productive. He denies allergies, has lived in [place] x 23 yrs. No heartburn, recurrent chest colds or post nasaldrip, or childhood hisotry of asthma or wheezing. No fevers or exposure to TB. CXR 06/29/09 no infx or effusions. Acid suppression x 6 weeks +CPM/ PSE combination did not help Tussionex 2.5- 5ml by mouth at bedtime helps for a few hours Tessalon perles did not help. coughing spells in the office with minimal green phlegm. [facility] ED visit - reviewed labs WC 11.4, BUn/ cr 10/0.87, AEC 900 CT chest reviewed -3 mm nodule LLL, mild mediastinal lymphadenopathy bihilar, & subcarinal (calcified)   Reviewed bscopy data, afb/ fungal cx neg. Bronchial tissue shows infalmmation - lymphocytes mixed with eosinophils , no granulomas. Absolute eos count 900 Advair & avelox have helped.    prescribed tussionex x 150 ml, promet- DM makes him nauseous   December 22, 2009 10:49 AM  Cough disapeared after bscopy in april '11. He feels it may have ben related to cold weather. He has relapsed now with similar cough - no clear environmental or work related  trigger.  -reviewed allergy testing  pos for grass, roach , IgE 196 Spirometry (coughing fits) showed moderate restriction, no obstruction  Current Medications (verified): 1)  Mucus Relief 400 Mg Tabs (Guaifenesin) .... Take 1 Tablet By Mouth Three Times A Day 2)  Vicks Nyquil Cough 6.25-15 Mg/63ml Liqd (Doxylamine-Dm) .... 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   Risk Factors: Smoking Status: quit (07/20/2009) Packs/Day: 0.5 (07/20/2009)  Review of Systems       The patient complains of prolonged cough.  The patient denies anorexia, fever, weight loss, weight gain, vision loss, decreased hearing, hoarseness, chest pain, syncope, dyspnea on exertion, peripheral edema, headaches, hemoptysis, abdominal pain, melena, hematochezia, severe indigestion/heartburn, hematuria, suspicious skin lesions, difficulty walking, depression, unusual weight change, and abnormal bleeding.    Vital Signs:  Patient profile:   45 year old male Height:      63 inches Weight:      137 pounds BMI:     24.36 O2 Sat:      95 % on Room air Temp:     98.2 degrees F oral Pulse rate:   85 / minute BP sitting:   128 / 80  (right arm) Cuff size:   regular  Vitals Entered By: Zackery Barefoot CMA (December 22, 2009 10:43 AM)  O2 Flow:  Room air CC: Pt c/o intermittent productive cough clear to green mucus worse at night and when laying down  Comments Medications reviewed with patient Verified contact number and pharmacy with patient Zackery Barefoot CMA  December 22, 2009 10:43 AM    Physical Exam  Additional Exam:  Gen. Pleasant, well-nourished, in no distress, normal affect ENT - no lesions, no post nasal drip Neck: No JVD, no thyromegaly, no carotid bruits Lungs: no use of accessory muscles, no dullness to percussion, clear without  rales or rhonchi  Cardiovascular: Rhythm regular, heart sounds  normal, no murmurs or gallops, no peripheral edema Musculoskeletal: No deformities, no cyanosis or clubbing      Pulmonary Function Test Date: 12/22/2009 11:10 AM Gender: Male  Pre-Spirometry FVC    Value: 2.25 L/min   % Pred: 58.20 % FEV1    Value: 1.63 L     Pred: 3.09 L     % Pred: 52.80 % FEV1/FVC  Value: 72.49 %     % Pred: 91.50 %  Impression & Recommendations:  Problem # 1:   COUGH (ICD-786.2)  Allergy testing s/o outdoor allergen, did respond to inhaled steroids last time. Also note high IgE level Qvar 80 2 puffs two times a day - sample, will keep him on this for longer duration this time ? 3-6 months Zyrtec -D once daily (OTC) COugh syrup (will make sleepy - NO DRIVING) at bedtime as needed   Orders: Est. Patient Level IV (54627) Prescription Created Electronically [phone]) Spirometry w/Graph (94010)  Medications Added to Medication List This Visit: 1)  Mucus Relief 400 Mg Tabs (Guaifenesin) .... Take 1 tablet by mouth three times a day 2)  Vicks Nyquil Cough 6.25-15 Mg/48ml Liqd (Doxylamine-dm) .... At bedtime 3)  Tussionex Pennkinetic Er 8-10 Mg/7ml Lqcr (Chlorpheniramine-hydrocodone) .... 2.5 - 5ml by mouth two times a day as needed  Patient Instructions: 1)  Please schedule a follow-up appointment in 2 weeks with TP 2)  Qvar 80 2 puffs two times a day - sample 3)  Zyrtec -D once daily (OTC) 4)  Spirometry 5)  Allergy teset results 6)  COugh syrup (will make sleepy - NO DRIVING) at bedtime as needed  Prescriptions: TUSSIONEX PENNKINETIC ER 8-10 MG/5ML LQCR (CHLORPHENIRAMINE-HYDROCODONE) 2.5 - 5ml by mouth two times a day as needed  #75 x 0   Entered and Authorized by:   Comer Locket. Vassie Loll MD   Signed by:   Comer Locket Vassie Loll MD on 12/22/2009   Method used:   Print then Give to Patient   RxID:   [id number]      CardioPerfect Spirometry  ID: [id number] Patient: Hunter Stanley, Hunter Stanley DOB: Oct 15, 1965 Age: 45 Years Old Sex: Male Race: Pacific Islander Height: 63 Weight: 137 PPD: 0.5 Status: Confirmed Past Medical History:  none Recorded: 12/22/2009 11:10 AM  Parameter  Measured Predicted %Predicted FVC     2.25        3.86        58.20 FEV1     1.63        3.09        52.80 FEV1%   72.49        79.18        91.50 PEF    4.06        8.59        47.20   Interpretation:  Moderately severe restriction

## 2010-07-24 NOTE — Assessment & Plan Note (Signed)
Summary: rov 4 wks ///kp   Visit Type:  Follow-up Copy to:  self Primary Provider/Referring Provider:  n/a  CC:  Patient here for 4 week follow up. 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  Cough was of insidious onset, no preceding URI,  mostly non productive. He denies allergies, has lived in [place] x 23 yrs. No heartburn, recurrent chest colds or post nasaldrip, or childhood hisotry of asthma or wheezing. No fevers or exposure to TB. CXR 06/29/09 no infx or effusions. Acid suppression x 6 weeks +CPM/ PSE combination did not help Tussionex 2.5- 5ml by mouth at bedtime helps for a few hours Tessalon perles did not help. coughing spells in the office with minimal green phlegm. HP ED visit - reviewed labs WC 11.4, BUn/ cr 10/0.87, AEC 900 CT chest reviewed -3 mm nodule LLL, mild mediastinal lymphadenopathy bihilar, & subcarinal (calcified)   Reviewed bscopy data, afb/ fungal cx neg. Bronchial tissue shows infalmmation - lymphocytes mixed with eosinophils , no granulomas. Absolute eos count 900 Advair & avelox have helped.    prescribed tussionex x 150 ml, promet- DM makes him nauseous   September 29, 2009 5:52 PM  Cough disapeared after bscopy. Now off advair x 1 wk & off steroids by mouth x 10 ds. He feels it may have ben related to cold weather. Discussed chances of relapse.  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   Review of Systems  The patient denies anorexia, fever, weight loss, weight gain, vision loss, decreased hearing, hoarseness, chest pain, syncope, dyspnea on exertion, peripheral edema, prolonged cough, headaches,  hemoptysis, abdominal pain, melena, hematochezia, severe indigestion/heartburn, hematuria, muscle weakness, transient blindness, difficulty walking, depression, unusual weight change, and abnormal bleeding.    Vital Signs:  Patient profile:   45 year old male Height:      63 inches Weight:      143 pounds O2 Sat:      96 % on Room air Temp:     98.1 degrees F rectal Pulse rate:   83 / minute BP sitting:   118 / 76  (right arm) Cuff size:   regular  Vitals Entered By: Carver Fila SMA (September 29, 2009 4:34 PM)  O2 Flow:  Room air CC: Patient here for 4 week follow up. Cough gone completely, patient states breathing is okay. Comments Medications reviewed with patient Verified contact number and pharmacy with patient Carver Fila SMA  September 29, 2009 4:35 PM     Physical Exam  Additional Exam:  Gen. 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 Observe off all meds . If recurs, will use inhaled steroids  Orders: Est. Patient Level III (16109)  Patient Instructions: 1)  Please schedule  a follow-up appointment as needed if cough returns

## 2010-09-17 LAB — AFB CULTURE WITH SMEAR (NOT AT ARMC): Acid Fast Smear: NONE SEEN

## 2010-09-17 LAB — CULTURE, RESPIRATORY W GRAM STAIN

## 2010-09-17 LAB — FUNGUS CULTURE W SMEAR: Fungal Smear: NONE SEEN

## 2011-03-18 ENCOUNTER — Encounter: Payer: Self-pay | Admitting: *Deleted

## 2011-03-18 ENCOUNTER — Emergency Department (HOSPITAL_BASED_OUTPATIENT_CLINIC_OR_DEPARTMENT_OTHER)
Admission: EM | Admit: 2011-03-18 | Discharge: 2011-03-19 | Disposition: A | Discharge status: Home / Self Care | Payer: Managed Care, Other (non HMO) | Attending: Emergency Medicine | Admitting: Emergency Medicine

## 2011-03-18 DIAGNOSIS — R062 Wheezing: Secondary | ICD-10-CM | POA: Insufficient documentation

## 2011-03-18 DIAGNOSIS — R059 Cough, unspecified: Secondary | ICD-10-CM | POA: Insufficient documentation

## 2011-03-18 DIAGNOSIS — R05 Cough: Secondary | ICD-10-CM | POA: Insufficient documentation

## 2011-03-18 MED ORDER — IPRATROPIUM BROMIDE 0.02 % IN SOLN
0.5000 mg | Freq: Once | RESPIRATORY_TRACT | Status: AC
  Administered 2011-03-19: 0.5 mg via RESPIRATORY_TRACT
  Filled 2011-03-18: qty 2.5

## 2011-03-18 MED ORDER — ALBUTEROL SULFATE (5 MG/ML) 0.5% IN NEBU
2.5000 mg | INHALATION_SOLUTION | Freq: Once | RESPIRATORY_TRACT | Status: AC
  Administered 2011-03-19: 2.5 mg via RESPIRATORY_TRACT
  Filled 2011-03-18: qty 0.5

## 2011-03-18 NOTE — ED Notes (Signed)
Pt has not been confirmed to have an hx of asthma but has been sick with cold. Pt has had a productive cough that is green, yellow or white at times. Pt presented with [facility] and does have a translator present at bedside.

## 2011-03-18 NOTE — ED Notes (Signed)
Pt was given an RX (maintence: Qvar MDI) at the MD but no Albuterol to treat his tx.

## 2011-03-18 NOTE — ED Notes (Signed)
Pt presnets to ED today with continued c/o [facility] and wheezing for the last few months.  Pt is reluctant to see doctors.

## 2011-03-19 ENCOUNTER — Emergency Department (INDEPENDENT_AMBULATORY_CARE_PROVIDER_SITE_OTHER): Payer: Managed Care, Other (non HMO)

## 2011-03-19 DIAGNOSIS — R05 Cough: Secondary | ICD-10-CM

## 2011-03-19 MED ORDER — ACETAMINOPHEN-CODEINE 120-12 MG/5ML PO SOLN: 5.0000 mL | Freq: Four times a day (QID) | ORAL | Status: AC | PRN

## 2011-03-19 MED ORDER — ALBUTEROL SULFATE HFA 108 (90 BASE) MCG/ACT IN AERS
2.0000 | INHALATION_SPRAY | RESPIRATORY_TRACT | Status: DC | PRN
  Administered 2011-03-19: 2 via RESPIRATORY_TRACT
  Filled 2011-03-19: qty 6.7

## 2011-03-19 MED ORDER — IPRATROPIUM BROMIDE 0.02 % IN SOLN
0.5000 mg | Freq: Once | RESPIRATORY_TRACT | Status: AC
  Administered 2011-03-19: 0.5 mg via RESPIRATORY_TRACT

## 2011-03-19 MED ORDER — PREDNISONE 20 MG PO TABS
ORAL_TABLET | ORAL | Status: AC
  Filled 2011-03-19: qty 3

## 2011-03-19 MED ORDER — IPRATROPIUM BROMIDE 0.02 % IN SOLN
RESPIRATORY_TRACT | Status: AC
  Filled 2011-03-19: qty 2.5

## 2011-03-19 MED ORDER — PREDNISONE 10 MG PO TABS: 20.0000 mg | ORAL_TABLET | Freq: Every day | ORAL | Status: AC

## 2011-03-19 MED ORDER — ALBUTEROL SULFATE (5 MG/ML) 0.5% IN NEBU
INHALATION_SOLUTION | RESPIRATORY_TRACT | Status: AC
  Filled 2011-03-19: qty 0.5

## 2011-03-19 MED ORDER — ALBUTEROL SULFATE HFA 108 (90 BASE) MCG/ACT IN AERS: 1.0000 | INHALATION_SPRAY | Freq: Four times a day (QID) | RESPIRATORY_TRACT | Status: AC | PRN

## 2011-03-19 MED ORDER — ALBUTEROL SULFATE (5 MG/ML) 0.5% IN NEBU
2.5000 mg | INHALATION_SOLUTION | Freq: Once | RESPIRATORY_TRACT | Status: AC
  Administered 2011-03-19: 2.5 mg via RESPIRATORY_TRACT

## 2011-03-19 MED ORDER — PREDNISONE 50 MG PO TABS
60.0000 mg | ORAL_TABLET | Freq: Once | ORAL | Status: AC
  Administered 2011-03-19: 60 mg via ORAL

## 2011-03-19 NOTE — ED Notes (Signed)
Pt talking with MD about X-Ray and chief complaint. Pt given an MDI Albuterol with spacer to use at home as needed.

## 2011-03-19 NOTE — ED Provider Notes (Signed)
History     CSN: [id number] Arrival date & time: 03/18/2011 11:55 PM  Chief Complaint  Patient presents with  . Cough  . Wheezing    HPI  (Consider location/radiation/quality/duration/timing/severity/associated sxs/prior treatment)  Patient is a 45 y.o. male presenting with cough and wheezing. The history is provided by the patient.  Cough This is a recurrent problem. The current episode started yesterday. Associated symptoms include wheezing. Pertinent negatives include no chest pain, no chills, no headaches and no shortness of breath.  Wheezing  Associated symptoms include cough and wheezing. Pertinent negatives include no chest pain, no fever and no shortness of breath.   patient has long-standing history of cough and wheezing. He has been evaluated by multiple pulmonologists. He has been admitted to [facility] for pneumonia in the past. He reports an extensive workup but has not been diagnosed with asthma. He previously smoked tobacco but does not anymore. He's been using an inhaler at home, but has persistent or worsening symptoms. No recent fever does have productive sputum. No leg swelling, chest pain, no weight loss. Location is in his lungs. No radiation. Quality described as persistent coughing and wheeze. Duration is chronic increasing over the last few days. Tonight unable to sleep due to symptoms.  History reviewed. No pertinent past medical history.  History reviewed. No pertinent past surgical history.  No family history on file.  History  Substance Use Topics  . Smoking status: Never Smoker   . Smokeless tobacco: Not on file  . Alcohol Use: No      Review of Systems  Review of Systems  Constitutional: Negative for fever and chills.  HENT: Negative for neck pain and neck stiffness.   Eyes: Negative for pain.  Respiratory: Positive for cough and wheezing. Negative for shortness of breath.   Cardiovascular: Negative for chest pain.  Gastrointestinal:  Negative for abdominal pain.  Genitourinary: Negative for dysuria.  Musculoskeletal: Negative for back pain.  Skin: Negative for rash.  Neurological: Negative for headaches.  All other systems reviewed and are negative.    Allergies  Review of patient's allergies indicates not on file.  Home Medications   Current Outpatient Rx  Name Route Sig Dispense Refill  . BECLOMETHASONE DIPROPIONATE 40 MCG/ACT IN AERS Inhalation Inhale 2 puffs into the lungs 2 (two) times daily.        Physical Exam    BP 126/69  Pulse 96  Temp(Src) 98.3 F (36.8 C) (Oral)  Resp 18  Ht 5\' 4"  (1.626 m)  Wt 130 lb (58.968 kg)  BMI 22.31 kg/m2  SpO2 95%  Physical Exam  Constitutional: He is oriented to person, place, and time. He appears well-developed and well-nourished.  HENT:  Head: Normocephalic and atraumatic.  Eyes: Conjunctivae and EOM are normal. Pupils are equal, round, and reactive to light.  Neck: Trachea normal. Neck supple. No thyromegaly present.  Cardiovascular: Normal rate, regular rhythm, S1 normal, S2 normal and normal pulses.     No systolic murmur is present   No diastolic murmur is present  Pulses:      Radial pulses are 2+ on the right side, and 2+ on the left side.  Pulmonary/Chest: Effort normal. He has no rhonchi.       Inspiratory and expiratory wheezes bilaterally with tachypnea. No respiratory distress. No stridor  Abdominal: Soft. Normal appearance and bowel sounds are normal. There is no tenderness. There is no CVA tenderness and negative Murphy's sign.  Musculoskeletal:       BLE:s  Calves nontender, no cords or erythema, negative Homans sign  Neurological: He is alert and oriented to person, place, and time. He has normal strength. No cranial nerve deficit or sensory deficit. GCS eye subscore is 4. GCS verbal subscore is 5. GCS motor subscore is 6.  Skin: Skin is warm and dry. No rash noted. He is not diaphoretic.  Psychiatric: His speech is normal.        Cooperative and appropriate    ED Course  Procedures (including critical care time)  Labs Reviewed - No data to display Dg Chest 2 View  03/19/2011  *RADIOLOGY REPORT*  Clinical Data: Chronic cough for 2 years.  Nonsmoker.  CHEST - 2 VIEW  Comparison: Chest radiograph 08/25/2009  Findings: The heart, mediastinal, and hilar contours are normal. The trachea is midline.  The lungs are normally expanded and clear. No airspace disease, effusion, or definite interstitial abnormality.  No acute bony abnormality.  Upper abdomen is unremarkable.  IMPRESSION: No acute cardiopulmonary disease.  Original Report Authenticated By: Britta Mccreedy, M.D.     Diagnosis: Wheezing  MDM  On arrival to the emergency department patient was given albuterol and Atrovent. He was given by mouth steroids. On recheck is doing much better but still has some wheezing was given a repeat breathing treatment. At 1:50 AM wheezes have resolved patient is much more comfortable and is requesting to be discharged home. He is given a prescription for prednisone, Tylenol codeine for cough as needed, and albuterol inhaler. He is referred to his pulmonologist Dr. Vassie Loll and agrees to be evaluated immediately for any fevers or worsening condition despite his medications       Sunnie Nielsen, MD 03/19/11 0210

## 2016-02-21 ENCOUNTER — Emergency Department (HOSPITAL_BASED_OUTPATIENT_CLINIC_OR_DEPARTMENT_OTHER): Payer: Worker's Compensation

## 2016-02-21 ENCOUNTER — Encounter (HOSPITAL_BASED_OUTPATIENT_CLINIC_OR_DEPARTMENT_OTHER): Payer: Self-pay | Admitting: Emergency Medicine

## 2016-02-21 ENCOUNTER — Emergency Department (HOSPITAL_BASED_OUTPATIENT_CLINIC_OR_DEPARTMENT_OTHER)
Admission: EM | Admit: 2016-02-21 | Discharge: 2016-02-21 | Disposition: A | Discharge status: Home / Self Care | Payer: Worker's Compensation | Attending: Emergency Medicine | Admitting: Emergency Medicine

## 2016-02-21 DIAGNOSIS — W268XXA Contact with other sharp object(s), not elsewhere classified, initial encounter: Secondary | ICD-10-CM | POA: Insufficient documentation

## 2016-02-21 DIAGNOSIS — Y939 Activity, unspecified: Secondary | ICD-10-CM | POA: Insufficient documentation

## 2016-02-21 DIAGNOSIS — J45909 Unspecified asthma, uncomplicated: Secondary | ICD-10-CM | POA: Insufficient documentation

## 2016-02-21 DIAGNOSIS — Z23 Encounter for immunization: Secondary | ICD-10-CM | POA: Diagnosis not present

## 2016-02-21 DIAGNOSIS — Y929 Unspecified place or not applicable: Secondary | ICD-10-CM | POA: Insufficient documentation

## 2016-02-21 DIAGNOSIS — Y999 Unspecified external cause status: Secondary | ICD-10-CM | POA: Insufficient documentation

## 2016-02-21 DIAGNOSIS — S61212A Laceration without foreign body of right middle finger without damage to nail, initial encounter: Secondary | ICD-10-CM | POA: Insufficient documentation

## 2016-02-21 DIAGNOSIS — T1490XA Injury, unspecified, initial encounter: Secondary | ICD-10-CM

## 2016-02-21 HISTORY — DX: Unspecified asthma, uncomplicated: J45.909

## 2016-02-21 MED ORDER — LIDOCAINE HCL (PF) 2 % IJ SOLN: 10.0000 mL | Freq: Once | INTRAMUSCULAR | Status: DC

## 2016-02-21 MED ORDER — LIDOCAINE HCL 2 % IJ SOLN
20.0000 mL | Freq: Once | INTRAMUSCULAR | Status: AC
  Administered 2016-02-21: 400 mg via INTRADERMAL
  Filled 2016-02-21: qty 20

## 2016-02-21 MED ORDER — ACETAMINOPHEN 325 MG PO TABS
650.0000 mg | ORAL_TABLET | Freq: Once | ORAL | Status: AC
  Administered 2016-02-21: 650 mg via ORAL
  Filled 2016-02-21: qty 2

## 2016-02-21 MED ORDER — TETANUS-DIPHTH-ACELL PERTUSSIS 5-2.5-18.5 LF-MCG/0.5 IM SUSP
0.5000 mL | Freq: Once | INTRAMUSCULAR | Status: AC
  Administered 2016-02-21: 0.5 mL via INTRAMUSCULAR
  Filled 2016-02-21: qty 0.5

## 2016-02-21 NOTE — ED Notes (Signed)
UDS completed, Pt state that he only needed UDS no BAT per his employer.

## 2016-02-21 NOTE — ED Provider Notes (Signed)
[facility] Provider Note   CSN: [id number] Arrival date & time: 02/21/16  1950  By signing my name below, I, Hunter Stanley, attest that this documentation has been prepared under the direction and in the presence of Pricilla Loveless, MD. Electronically Signed: Gillis Ends. Lyn Hollingshead, ED Scribe. 02/21/16. 8:43 PM.  History   Chief Complaint Chief Complaint  Patient presents with  . Finger Injury   The history is provided by the patient. No language interpreter was used.    HPI Comments: Hunter Stanley is a 50 y.o. male who presents to the Emergency Department complaining of a finger laceration sustained to his right middle finger which occurred about 2 hrs ago. Pt reports that he cut his finger after using a "band-saw." He has associated decreased range of motion of extremity. Pt reports pain is present upon applying pressure to the area. Bleeding is controlled with gauze. Denies any decreased sensation or weakness to his extremity. Tetanus status is out of date. Patient is right hand dominant.  Past Medical History:  Diagnosis Date  . Asthma     Patient Active Problem List   Diagnosis Date Noted  . SINUSITIS, CHRONIC 01/23/2010  . PULMONARY NODULE 08/26/2009  . MEDIASTINAL ADENOPATHY CASTLEMANS D 08/21/2009  . COUGH 07/20/2009    History reviewed. No pertinent surgical history.     Home Medications    Prior to Admission medications   Medication Sig Start Date End Date Taking? Authorizing Provider  albuterol (PROVENTIL HFA;VENTOLIN HFA) 108 (90 BASE) MCG/ACT inhaler Inhale 1-2 puffs into the lungs every 6 (six) hours as needed for wheezing. 03/19/11 03/18/12  Sunnie Nielsen, MD  beclomethasone (QVAR) 40 MCG/ACT inhaler Inhale 2 puffs into the lungs 2 (two) times daily.      Historical Provider, MD    Family History No family history on file.  Social History Social History  Substance Use Topics  . Smoking status: Never Smoker  . Smokeless tobacco:  Never Used  . Alcohol use No     Allergies   Review of patient's allergies indicates no known allergies.   Review of Systems Review of Systems  Skin: Positive for wound.  Neurological: Negative for weakness and numbness.  All other systems reviewed and are negative.  Physical Exam Updated Vital Signs BP 120/80 (BP Location: Left Arm)   Pulse 70   Temp 99 F (37.2 C) (Oral)   Resp 16   Ht 5\' 3"  (1.6 m)   Wt 125 lb (56.7 kg)   SpO2 100%   BMI 22.14 kg/m   Physical Exam  Constitutional: He is oriented to person, place, and time. He appears well-developed and well-nourished.  HENT:  Head: Normocephalic and atraumatic.  Right Ear: External ear normal.  Left Ear: External ear normal.  Nose: Nose normal.  Eyes: Right eye exhibits no discharge. Left eye exhibits no discharge.  Neck: Neck supple.  Cardiovascular: Normal rate and intact distal pulses.   Pulmonary/Chest: Effort normal.  Abdominal: He exhibits no distension.  Musculoskeletal:       Right hand: He exhibits tenderness, bony tenderness and laceration. Normal sensation noted. Normal strength noted.       Hands: Neurological: He is alert and oriented to person, place, and time.  Skin: Skin is warm and dry.  Nursing note and vitals reviewed.    ED Treatments / Results  DIAGNOSTIC STUDIES: Oxygen Saturation is 99% on RA, normal by my interpretation.    COORDINATION OF CARE: 8:42 PM-Discussed treatment plan which includes  X-ray of right hand and Tdap Booster with pt at bedside and pt agreed to plan.   Radiology Dg Finger Middle Right  Result Date: 02/21/2016 CLINICAL DATA:  Right middle finger laceration with saw. EXAM: RIGHT MIDDLE FINGER 2+V COMPARISON:  None. FINDINGS: There is no evidence of fracture or dislocation. There is no evidence of arthropathy or other focal bone abnormality. Soft tissues are unremarkable. No radiopaque foreign body is noted. IMPRESSION: No fracture or dislocation is noted.  Electronically Signed   By: Lupita RaiderJames  Green Jr, M.D.   On: 02/21/2016 21:19    Procedures .Nerve Block Date/Time: 02/21/2016 9:54 PM Performed by: Pricilla LovelessGOLDSTON, Rubye Strohmeyer Authorized by: Pricilla LovelessGOLDSTON, Bradleigh Sonnen   Consent:    Consent obtained:  Verbal   Consent given by:  Patient Indications:    Indications:  Pain relief and procedural anesthesia Location:    Body area:  Upper extremity   Upper extremity nerve blocked: digital.   Laterality:  Right Pre-procedure details:    Skin preparation:  Alcohol   Preparation: Patient was prepped and draped in usual sterile fashion   Skin anesthesia (see MAR for exact dosages):    Skin anesthesia method:  None Procedure details (see MAR for exact dosages):    Block needle gauge:  25 G   Guidance comment:  Landmarks   Anesthetic injected:  Lidocaine 2% w/o epi   Steroid injected:  None   Additive injected:  None   Injection procedure:  Anatomic landmarks identified and negative aspiration for blood   Paresthesia:  None Post-procedure details:    Patient tolerance of procedure:  Tolerated well, no immediate complications  .Marland Kitchen.Laceration Repair Date/Time: 02/21/2016 10:15 PM Performed by: Pricilla LovelessGOLDSTON, Araseli Sherry Authorized by: Pricilla LovelessGOLDSTON, Kalub Morillo   Consent:    Consent obtained:  Verbal   Consent given by:  Patient Anesthesia (see MAR for exact dosages):    Anesthesia method:  Nerve block Laceration details:    Location:  Finger   Finger location:  R long finger   Length (cm):  2 Repair type:    Repair type:  Simple Pre-procedure details:    Preparation:  Patient was prepped and draped in usual sterile fashion and imaging obtained to evaluate for foreign bodies Exploration:    Hemostasis achieved with:  Direct pressure   Wound exploration: wound explored through full range of motion and entire depth of wound probed and visualized     Contaminated: no   Treatment:    Amount of cleaning:  Standard   Irrigation solution:  Sterile saline   Irrigation method:   Syringe Skin repair:    Repair method:  Sutures   Suture size:  4-0   Suture material:  Prolene   Suture technique:  Simple interrupted   Number of sutures:  4 Approximation:    Approximation:  Close   Vermilion border: well-aligned   Post-procedure details:    Dressing:  Antibiotic ointment   Patient tolerance of procedure:  Tolerated well, no immediate complications   (including critical care time)  Medications Ordered in ED Medications  acetaminophen (TYLENOL) tablet 650 mg (650 mg Oral Given 02/21/16 2048)  Tdap (BOOSTRIX) injection 0.5 mL (0.5 mLs Intramuscular Given 02/21/16 2048)  lidocaine (XYLOCAINE) 2 % (with pres) injection 400 mg (400 mg Intradermal Given 02/21/16 2049)    Initial Impression / Assessment and Plan / ED Course  I have reviewed the triage vital signs and the nursing notes.  Pertinent labs & imaging results that were available during my care of the patient  were reviewed by me and considered in my medical decision making (see chart for details).  Clinical Course    Patient resents with a linear laceration of his right middle finger. Tetanus updated. Repaired as above. Neurovascular intact and does not appear to have a significant tendon injury as he has full range of motion of his finger. Follow-up with PCP and/or urgent care for suture removal. No fracture or gross contamination.  Final Clinical Impressions(s) / ED Diagnoses   Final diagnoses:  Laceration of right middle finger w/o foreign body w/o damage to nail, initial encounter    New Prescriptions Discharge Medication List as of 02/21/2016 10:18 PM     I personally performed the services described in this documentation, which was scribed in my presence. The recorded information has been reviewed and is accurate.     Pricilla Loveless, MD 02/21/16 [phone]

## 2016-02-21 NOTE — ED Notes (Signed)
MD at bedside. 

## 2017-05-22 IMAGING — CR DG FINGER MIDDLE 2+V*R*
3 series · 3 of 3 positions shown · non-contrast
Comparison: None.

CLINICAL DATA: Right middle finger laceration with saw.

EXAM:
RIGHT MIDDLE FINGER 2+V

[x finger pa right]
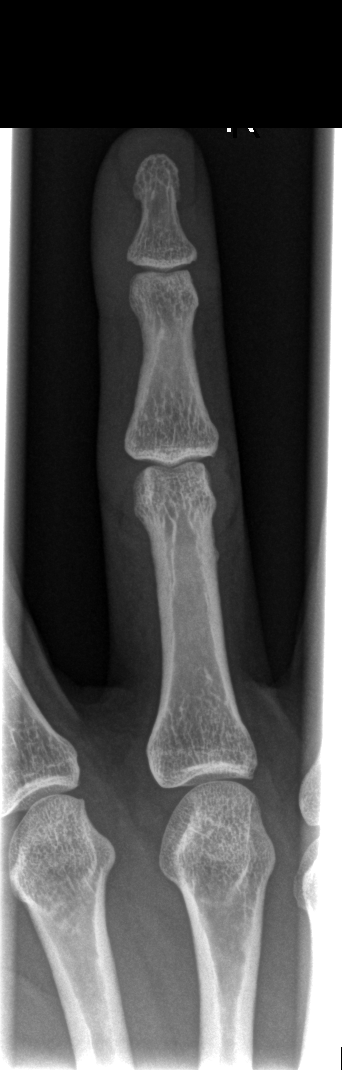

[x finger obl. right]
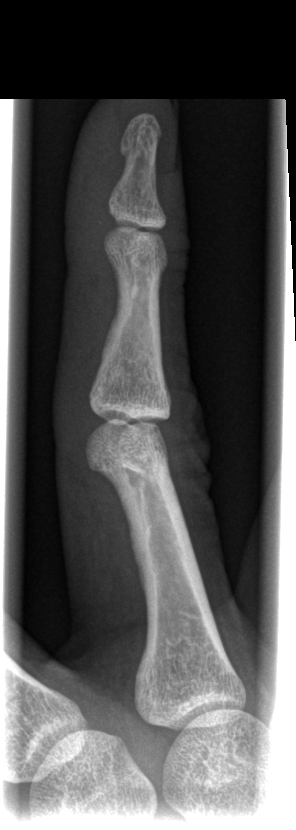

[x finger lateral right]
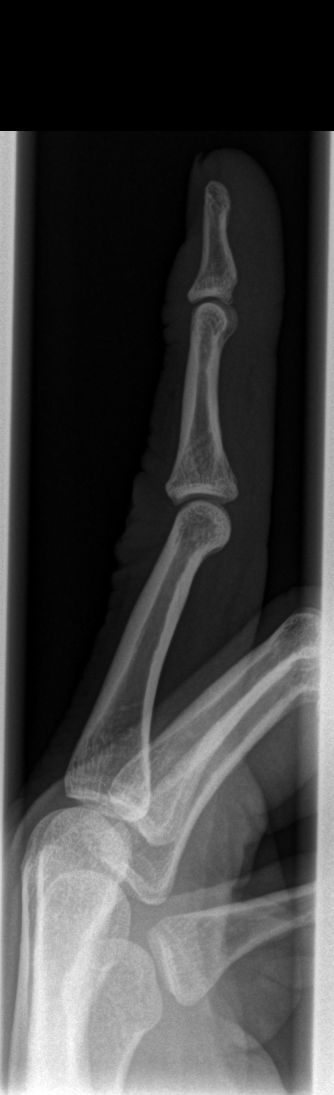

[3 of 3 positions shown; findings below may reference images not displayed]

FINDINGS: There is no evidence of fracture or dislocation. There is no
evidence of arthropathy or other focal bone abnormality. Soft
tissues are unremarkable. No radiopaque foreign body is noted.
IMPRESSION: No fracture or dislocation is noted.

## 2024-08-17 ENCOUNTER — Ambulatory Visit: Admitting: Internal Medicine
